# Patient Record
Sex: Male | Born: 1935 | Race: White | Hispanic: No | Marital: Single | Smoking: Former smoker
Health system: Southern US, Community
[De-identification: ages and names within clinical notes are randomized; demographics above are authoritative.]

## PROBLEM LIST (undated history)

## (undated) DIAGNOSIS — N289 Disorder of kidney and ureter, unspecified: Secondary | ICD-10-CM

## (undated) DIAGNOSIS — I2510 Atherosclerotic heart disease of native coronary artery without angina pectoris: Secondary | ICD-10-CM

## (undated) DIAGNOSIS — I4891 Unspecified atrial fibrillation: Secondary | ICD-10-CM

## (undated) DIAGNOSIS — I509 Heart failure, unspecified: Secondary | ICD-10-CM

## (undated) DIAGNOSIS — I10 Essential (primary) hypertension: Secondary | ICD-10-CM

## (undated) DIAGNOSIS — E785 Hyperlipidemia, unspecified: Secondary | ICD-10-CM

## (undated) HISTORY — PX: KNEE SURGERY: SHX244

## (undated) HISTORY — PX: SHOULDER SURGERY: SHX246

## (undated) HISTORY — PX: CARDIAC SURGERY: SHX584

## (undated) HISTORY — PX: INSERTION OF ICD: SHX6689

---

## 2011-06-09 ENCOUNTER — Emergency Department (INDEPENDENT_AMBULATORY_CARE_PROVIDER_SITE_OTHER): Payer: Medicare Other

## 2011-06-09 ENCOUNTER — Emergency Department (HOSPITAL_BASED_OUTPATIENT_CLINIC_OR_DEPARTMENT_OTHER)
Admission: EM | Admit: 2011-06-09 | Discharge: 2011-06-10 | Disposition: A | Discharge status: Home / Self Care | Payer: Medicare Other | Attending: Emergency Medicine | Admitting: Emergency Medicine

## 2011-06-09 ENCOUNTER — Encounter: Payer: Self-pay | Admitting: *Deleted

## 2011-06-09 DIAGNOSIS — S99919A Unspecified injury of unspecified ankle, initial encounter: Secondary | ICD-10-CM

## 2011-06-09 DIAGNOSIS — S8000XA Contusion of unspecified knee, initial encounter: Secondary | ICD-10-CM | POA: Insufficient documentation

## 2011-06-09 DIAGNOSIS — IMO0002 Reserved for concepts with insufficient information to code with codable children: Secondary | ICD-10-CM

## 2011-06-09 DIAGNOSIS — W208XXA Other cause of strike by thrown, projected or falling object, initial encounter: Secondary | ICD-10-CM | POA: Insufficient documentation

## 2011-06-09 DIAGNOSIS — I10 Essential (primary) hypertension: Secondary | ICD-10-CM | POA: Insufficient documentation

## 2011-06-09 DIAGNOSIS — S8990XA Unspecified injury of unspecified lower leg, initial encounter: Secondary | ICD-10-CM

## 2011-06-09 DIAGNOSIS — S93409A Sprain of unspecified ligament of unspecified ankle, initial encounter: Secondary | ICD-10-CM | POA: Insufficient documentation

## 2011-06-09 DIAGNOSIS — Z9181 History of falling: Secondary | ICD-10-CM

## 2011-06-09 DIAGNOSIS — I77819 Aortic ectasia, unspecified site: Secondary | ICD-10-CM

## 2011-06-09 DIAGNOSIS — I517 Cardiomegaly: Secondary | ICD-10-CM

## 2011-06-09 DIAGNOSIS — I2510 Atherosclerotic heart disease of native coronary artery without angina pectoris: Secondary | ICD-10-CM | POA: Insufficient documentation

## 2011-06-09 DIAGNOSIS — M949 Disorder of cartilage, unspecified: Secondary | ICD-10-CM

## 2011-06-09 DIAGNOSIS — S40019A Contusion of unspecified shoulder, initial encounter: Secondary | ICD-10-CM | POA: Insufficient documentation

## 2011-06-09 DIAGNOSIS — E119 Type 2 diabetes mellitus without complications: Secondary | ICD-10-CM | POA: Insufficient documentation

## 2011-06-09 DIAGNOSIS — M899 Disorder of bone, unspecified: Secondary | ICD-10-CM

## 2011-06-09 HISTORY — DX: Essential (primary) hypertension: I10

## 2011-06-09 HISTORY — DX: Atherosclerotic heart disease of native coronary artery without angina pectoris: I25.10

## 2011-06-09 NOTE — ED Notes (Signed)
Pt states he was loading a garden tractor and it fell on him. C/O bruising and pain mostly on the left side from his ankle up. Left ankle is swollen and pt has bruising there as well as the leg and groin area. Ambulatory to ED

## 2011-06-09 NOTE — ED Provider Notes (Signed)
Scribed for Dr. Judd Lien, the patient was seen in room 01. This chart was scribed by Hillery Hunter. This patient's care was started at 22:20.   History   CSN: [id number] Arrival date & time: 06/09/2011  9:58 PM  Chief Complaint  Patient presents with  . Trauma   Patient is a 77 y.o. male presenting with trauma. The history is provided by the patient and a relative.  Trauma Pertinent negatives include no chest pain, no abdominal pain, no headaches and no shortness of breath.    Lenis Nettleton is a 77 y.o. male who presents to the Emergency Department complaining of an accidental trauma. He reports that he was at home loading an 800lb garden tractor onto a trailer and because the ramp was too steep, the wheels turned off and the tractor fell backwards onto him. He complains now primarily of left ankle pain but has some abrasions on his left knee and shoulder as well. He was walking around after the incident and has increased pain of the ankle with weight bearing. He has a history of bilateral knee replacements (5 months ago for right side and 5 years ago on the left) and is taking coumadin. He denies dyspnea, head pain, chest pain, or other complaints.  Past Medical History  Diagnosis Date  . Diabetes mellitus   . Hypertension   . Coronary artery disease     Past Surgical History  Procedure Date  . Cardiac surgery   . Shoulder surgery   . Knee surgery     History reviewed. No pertinent family history.  History  Substance Use Topics  . Smoking status: Never Smoker   . Smokeless tobacco: Not on file  . Alcohol Use: No      Review of Systems  HENT: Negative for neck pain.   Respiratory: Negative for shortness of breath.   Cardiovascular: Negative for chest pain.  Gastrointestinal: Negative for abdominal pain.  Musculoskeletal: Negative for back pain.  Neurological: Negative for headaches.  Psychiatric/Behavioral: Negative for confusion.  All other systems reviewed and  are negative.    Physical Exam  BP 133/72  Pulse 68  Temp(Src) 98.6 F (37 C) (Oral)  Resp 20  Ht 5\' 11"  (1.803 m)  Wt 208 lb (94.348 kg)  BMI 29.01 kg/m2  SpO2 97%  Physical Exam  Nursing note and vitals reviewed. Constitutional:       Awake, alert, nontoxic appearance with baseline speech for patient.  HENT:  Head: Atraumatic.  Eyes: EOM are normal. Pupils are equal, round, and reactive to light. Right eye exhibits no discharge. Left eye exhibits no discharge.  Neck: Neck supple.  Cardiovascular: Normal rate and regular rhythm.   No murmur heard. Pulmonary/Chest: Effort normal and breath sounds normal. No stridor. No respiratory distress. He exhibits no tenderness.       Breath sounds equal  Abdominal: Soft. Bowel sounds are normal. He exhibits no mass. There is no tenderness. There is no rebound.  Musculoskeletal: He exhibits no tenderness.       Baseline ROM, moves extremities with no obvious new focal weakness.  Lymphadenopathy:    He has no cervical adenopathy.  Neurological:       Awake, alert, cooperative and aware of situation; motor strength bilaterally; sensation normal to light touch bilaterally  Skin: No rash noted.  Psychiatric: He has a normal mood and affect.    ED Course  Procedures  OTHER DATA REVIEWED: Nursing notes, vital signs, and past medical records reviewed.  DIAGNOSTIC STUDIES: Oxygen Saturation is 97% on room air, normal by my interpretation.    RADIOLOGY:   LEFT SHOULDER - 2+ VIEW  IMPRESSION:  1. No evidence of fracture or dislocation. 2. Lucencies within the humeral head may relate to osteoporosis or degenerative change. Cannot exclude myeloma. Recommend clinical correlation.  Original Report Authenticated By: Genevive Bi, M.D.  ------------------  CHEST - 1 VIEW  IMPRESSION: No evidence of thoracic trauma.  Original Report Authenticated By: Genevive Bi, M.D.  --------------------  LEFT KNEE - COMPLETE 4+  VIEW  IMPRESSION: No evidence of left knee fracture.  Original Report Authenticated By: Genevive Bi, M.D.  --------------------  LEFT ANKLE COMPLETE - 3+ VIEW  IMPRESSION: No evidence of left ankle fracture.  Original Report Authenticated By: Genevive Bi, M.D.  -------------------  ED COURSE / COORDINATION OF CARE: 22:31. Ordered XR left shoulder, left ankle, left knee and chest.   MDM: Xrays all normal.  Will discharge with rest, ice, time.  Declines pain medications.   IMPRESSION: Diagnoses that have been ruled out:  Diagnoses that are still under consideration:  Final diagnoses:     SCRIBE ATTESTATION: I personally performed the services described in this documentation, which was scribed in my presence. The recorded information has been reviewed and considered. Geoffery Lyons, MD      Geoffery Lyons, MD 06/09/11 [phone]

## 2012-09-01 IMAGING — CR DG SHOULDER 2+V*L*
3 series · 3 of 3 positions shown · non-contrast
Comparison: None.

CLINICAL DATA: Trauma the

LEFT SHOULDER - 2+ VIEW

[t shoulder ap internal left]
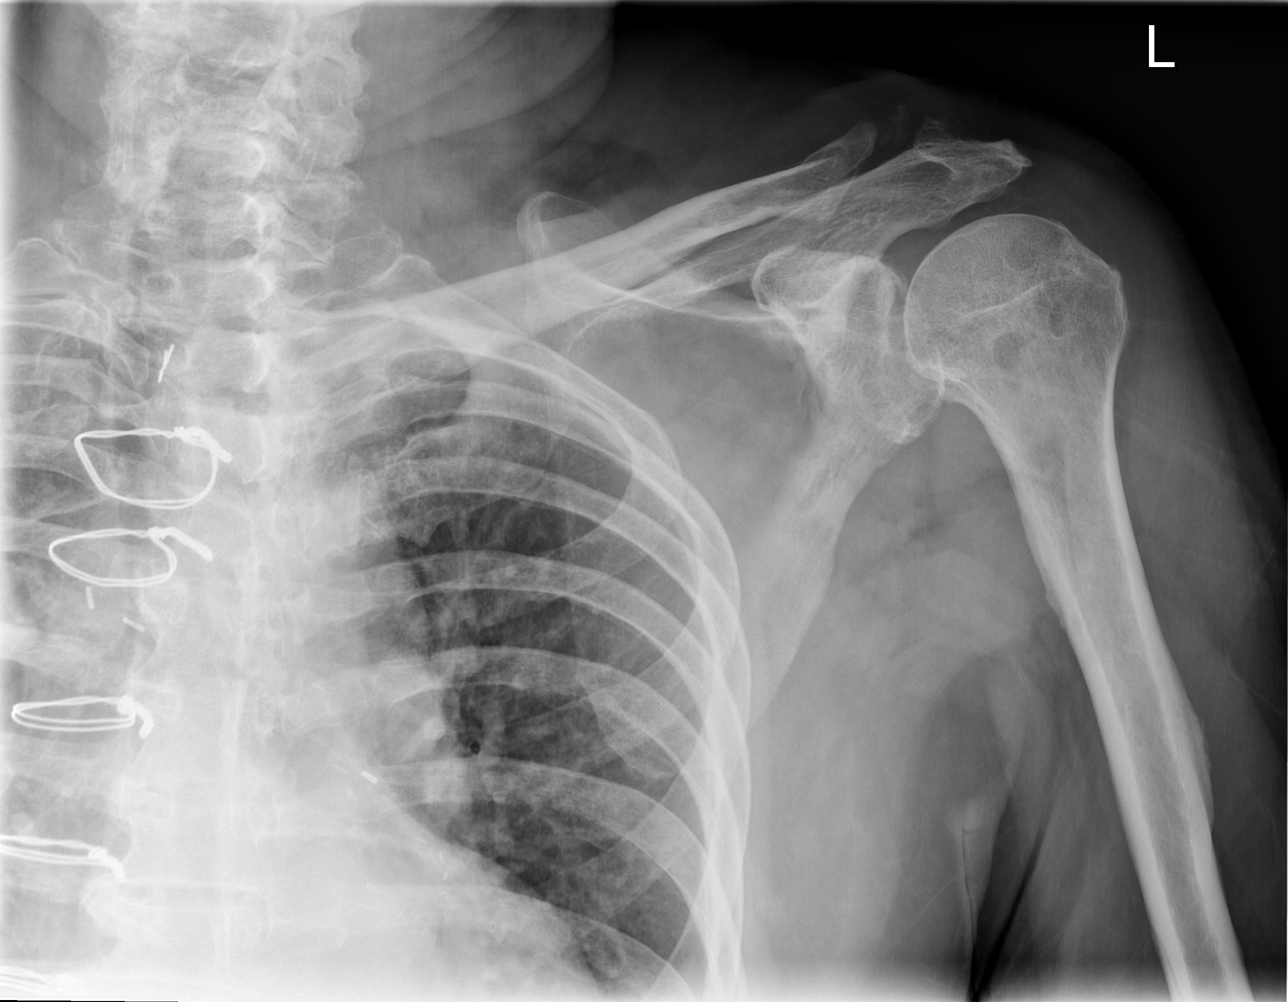

[t shoulder ap external left]
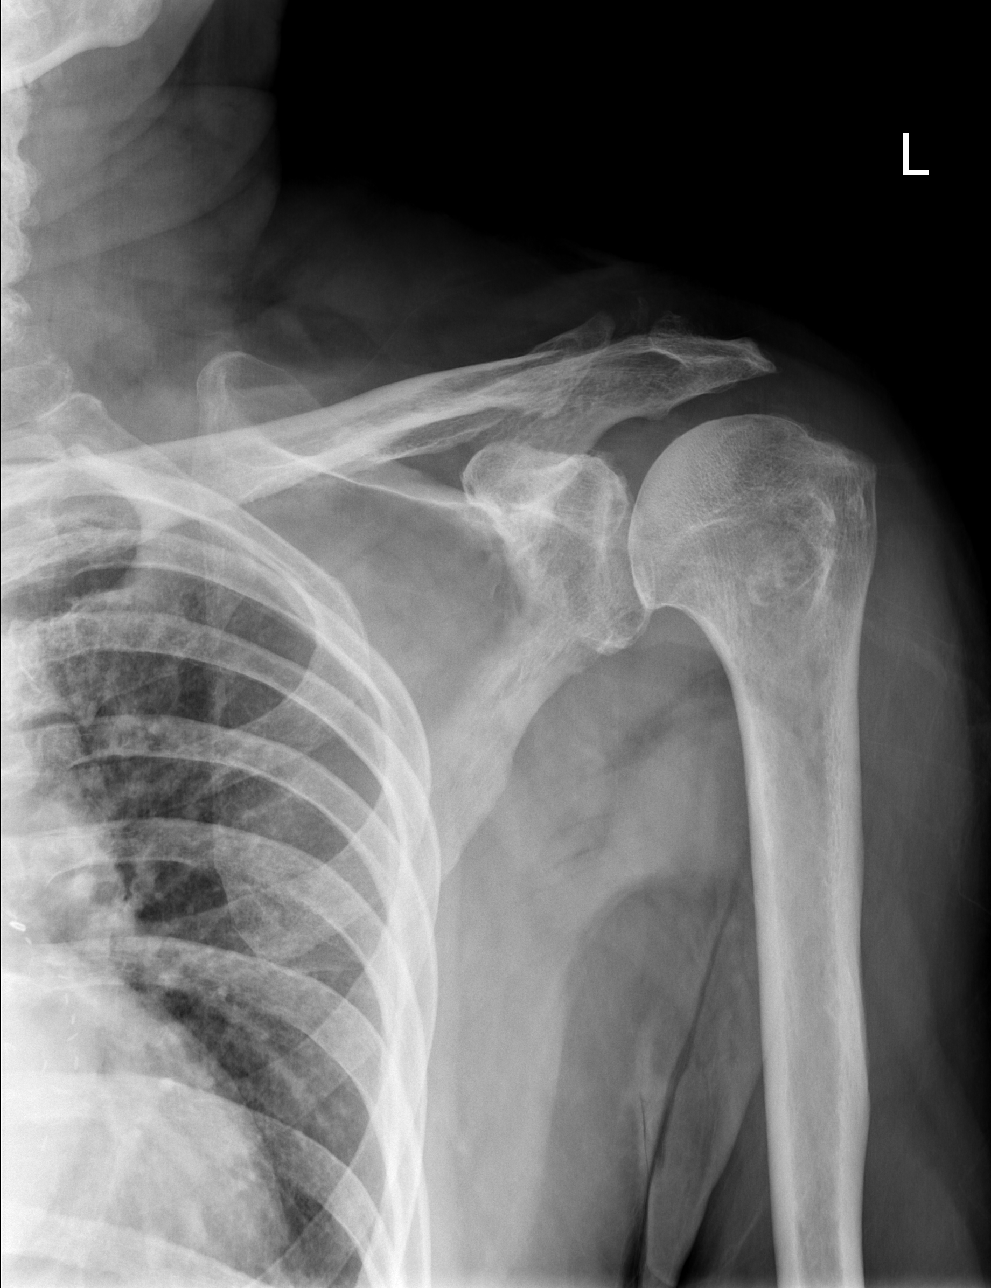

[t shoulder y view left]
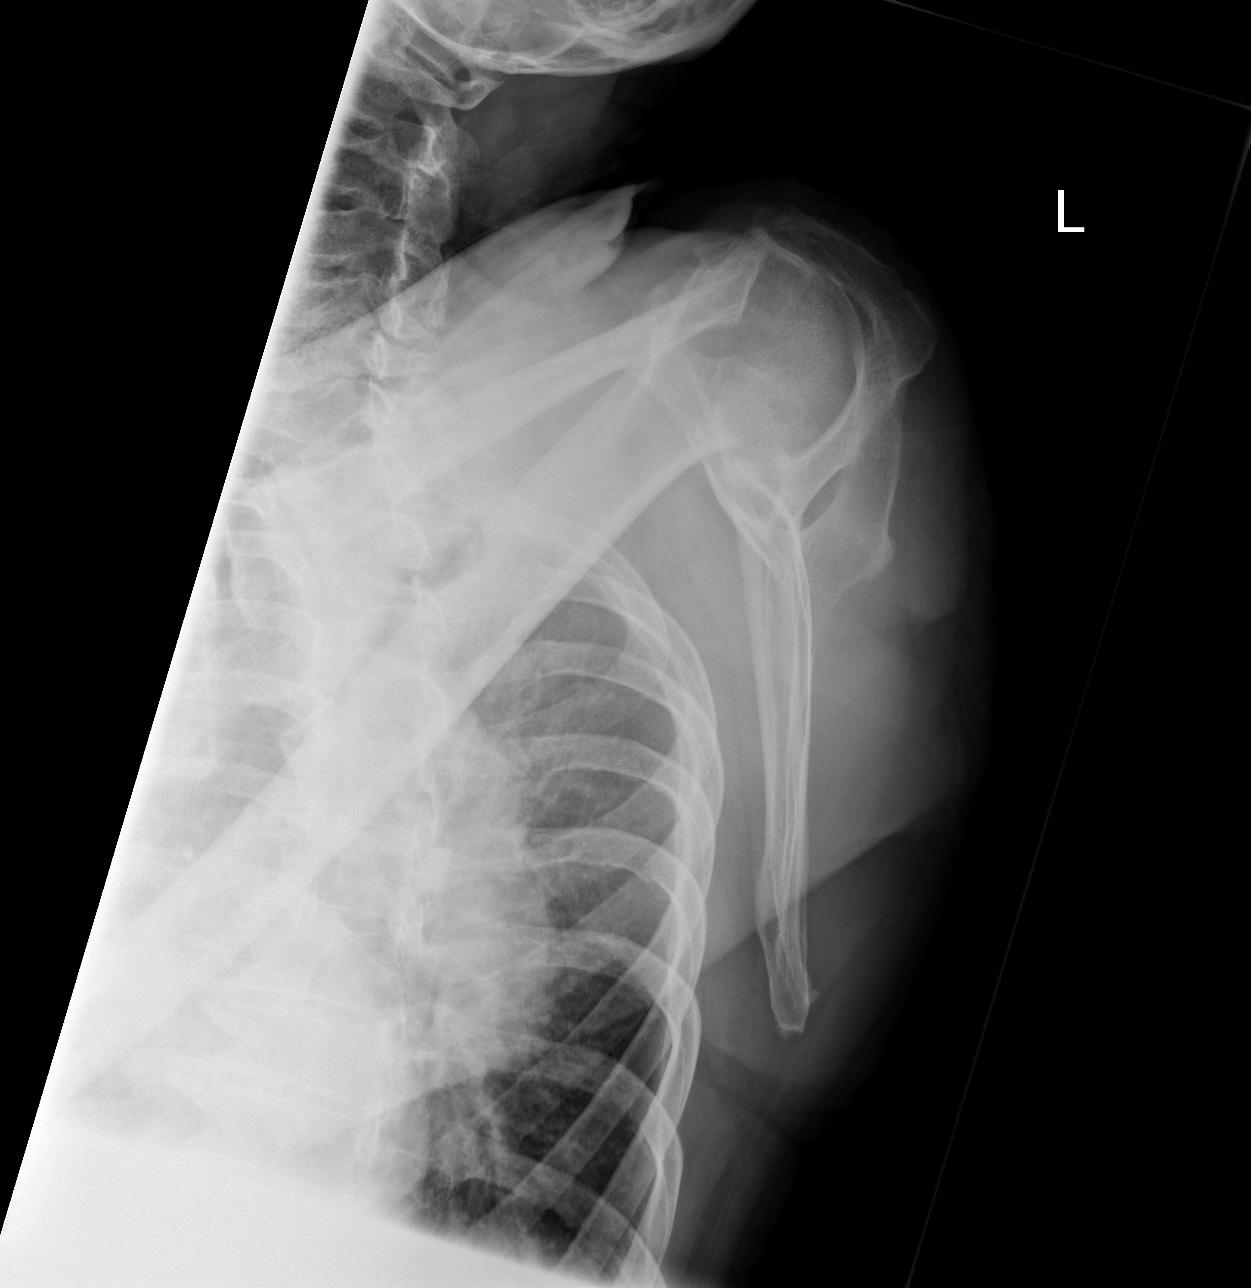

[3 of 3 positions shown; findings below may reference images not displayed]

FINDINGS: No evidence of fracture or dislocation of the left
shoulder.  There are lucencies within the left humeral head and
glenoid fossa.
IMPRESSION: 1.  No evidence of fracture or dislocation.
2.  Lucencies within the humeral head may relate to osteoporosis or
degenerative change.  Cannot exclude myeloma.  Recommend clinical
correlation.

## 2012-09-01 IMAGING — CR DG ANKLE COMPLETE 3+V*L*
3 series · 3 of 3 positions shown · non-contrast
Comparison: None.

CLINICAL DATA: Blunt trauma to the ankle from a very heavy object

LEFT ANKLE COMPLETE - 3+ VIEW

[t ankle joint ap left]
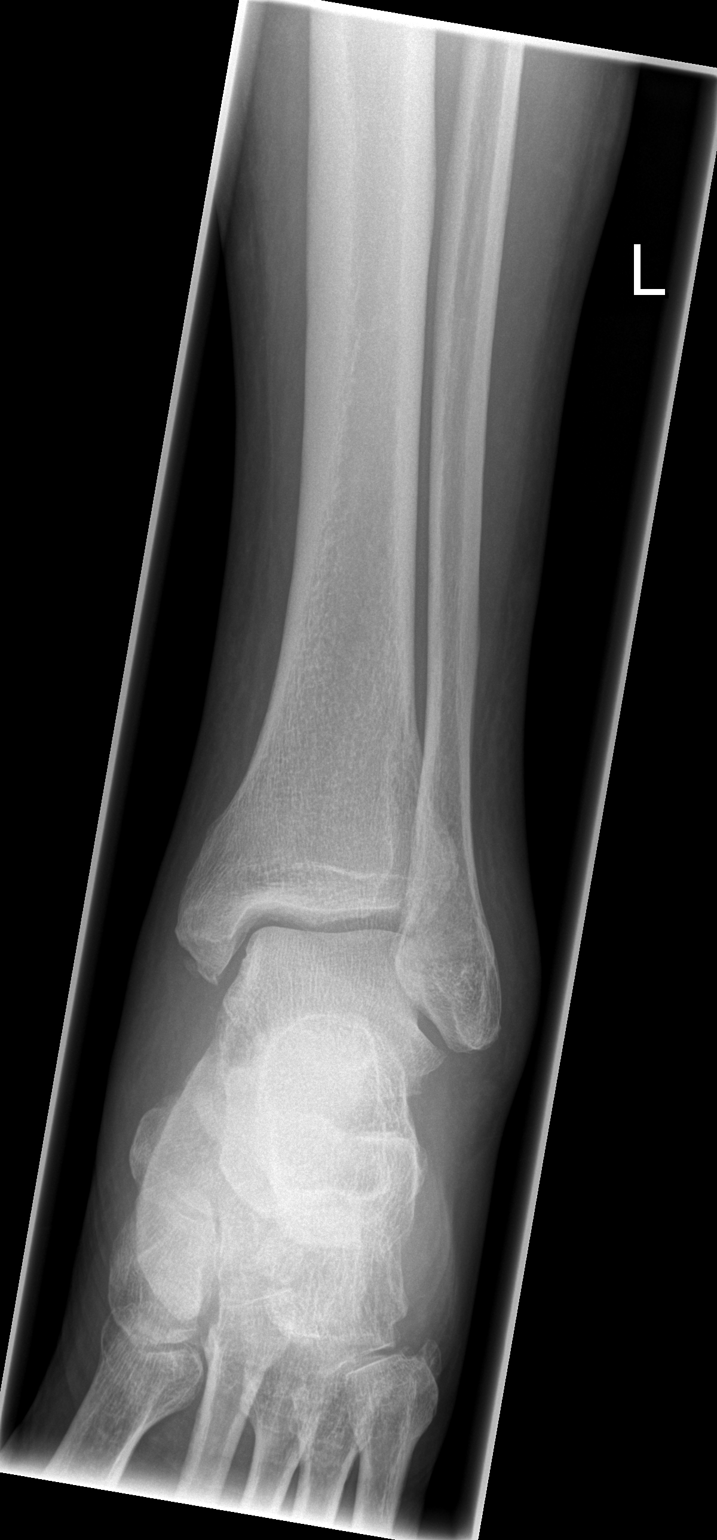

[t ankle joint oblique left]
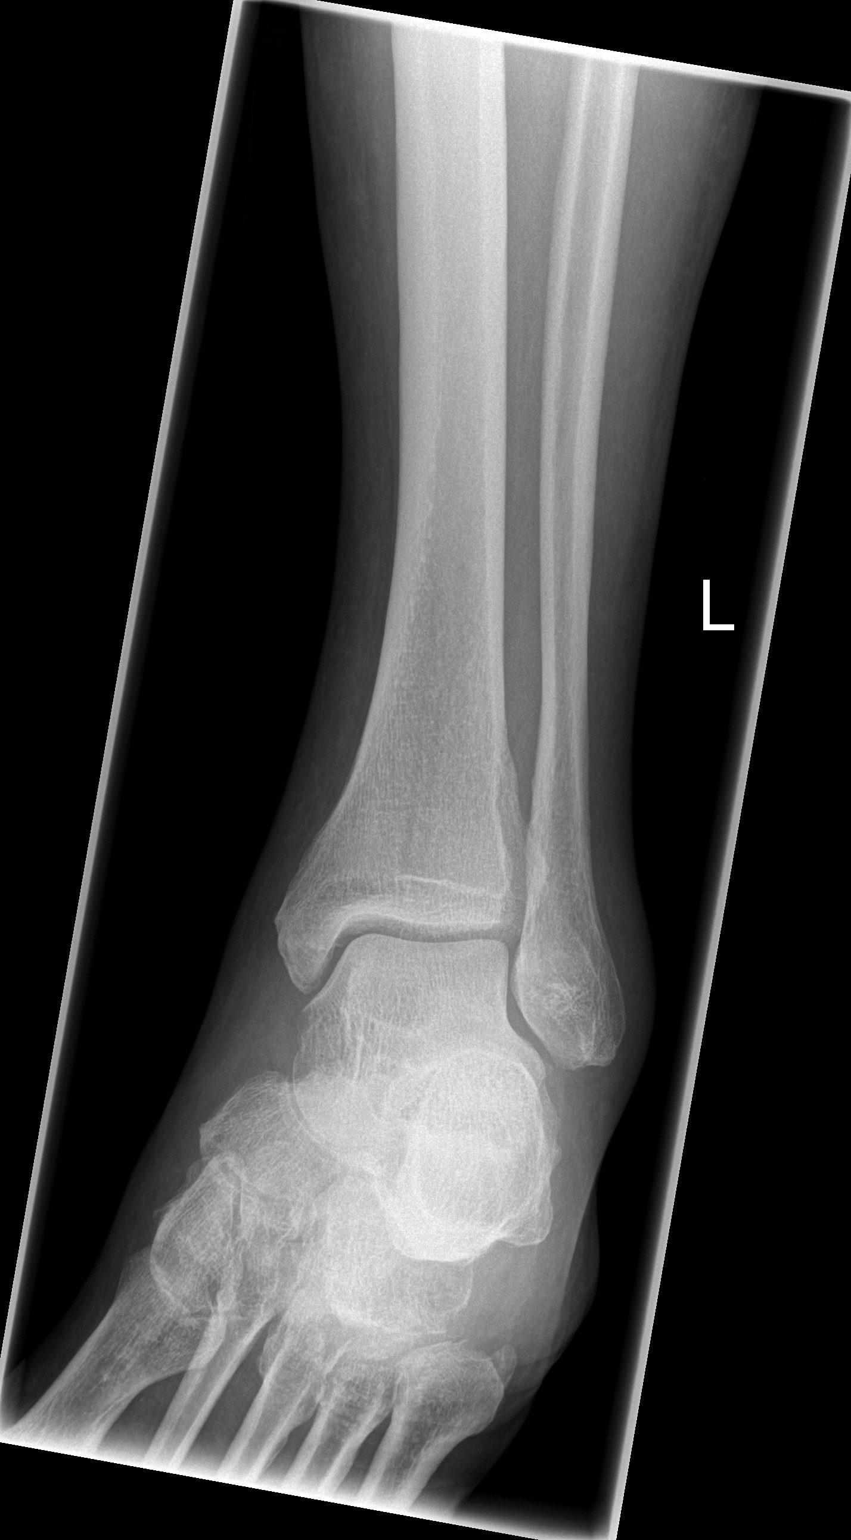

[t ankle joint lat left]
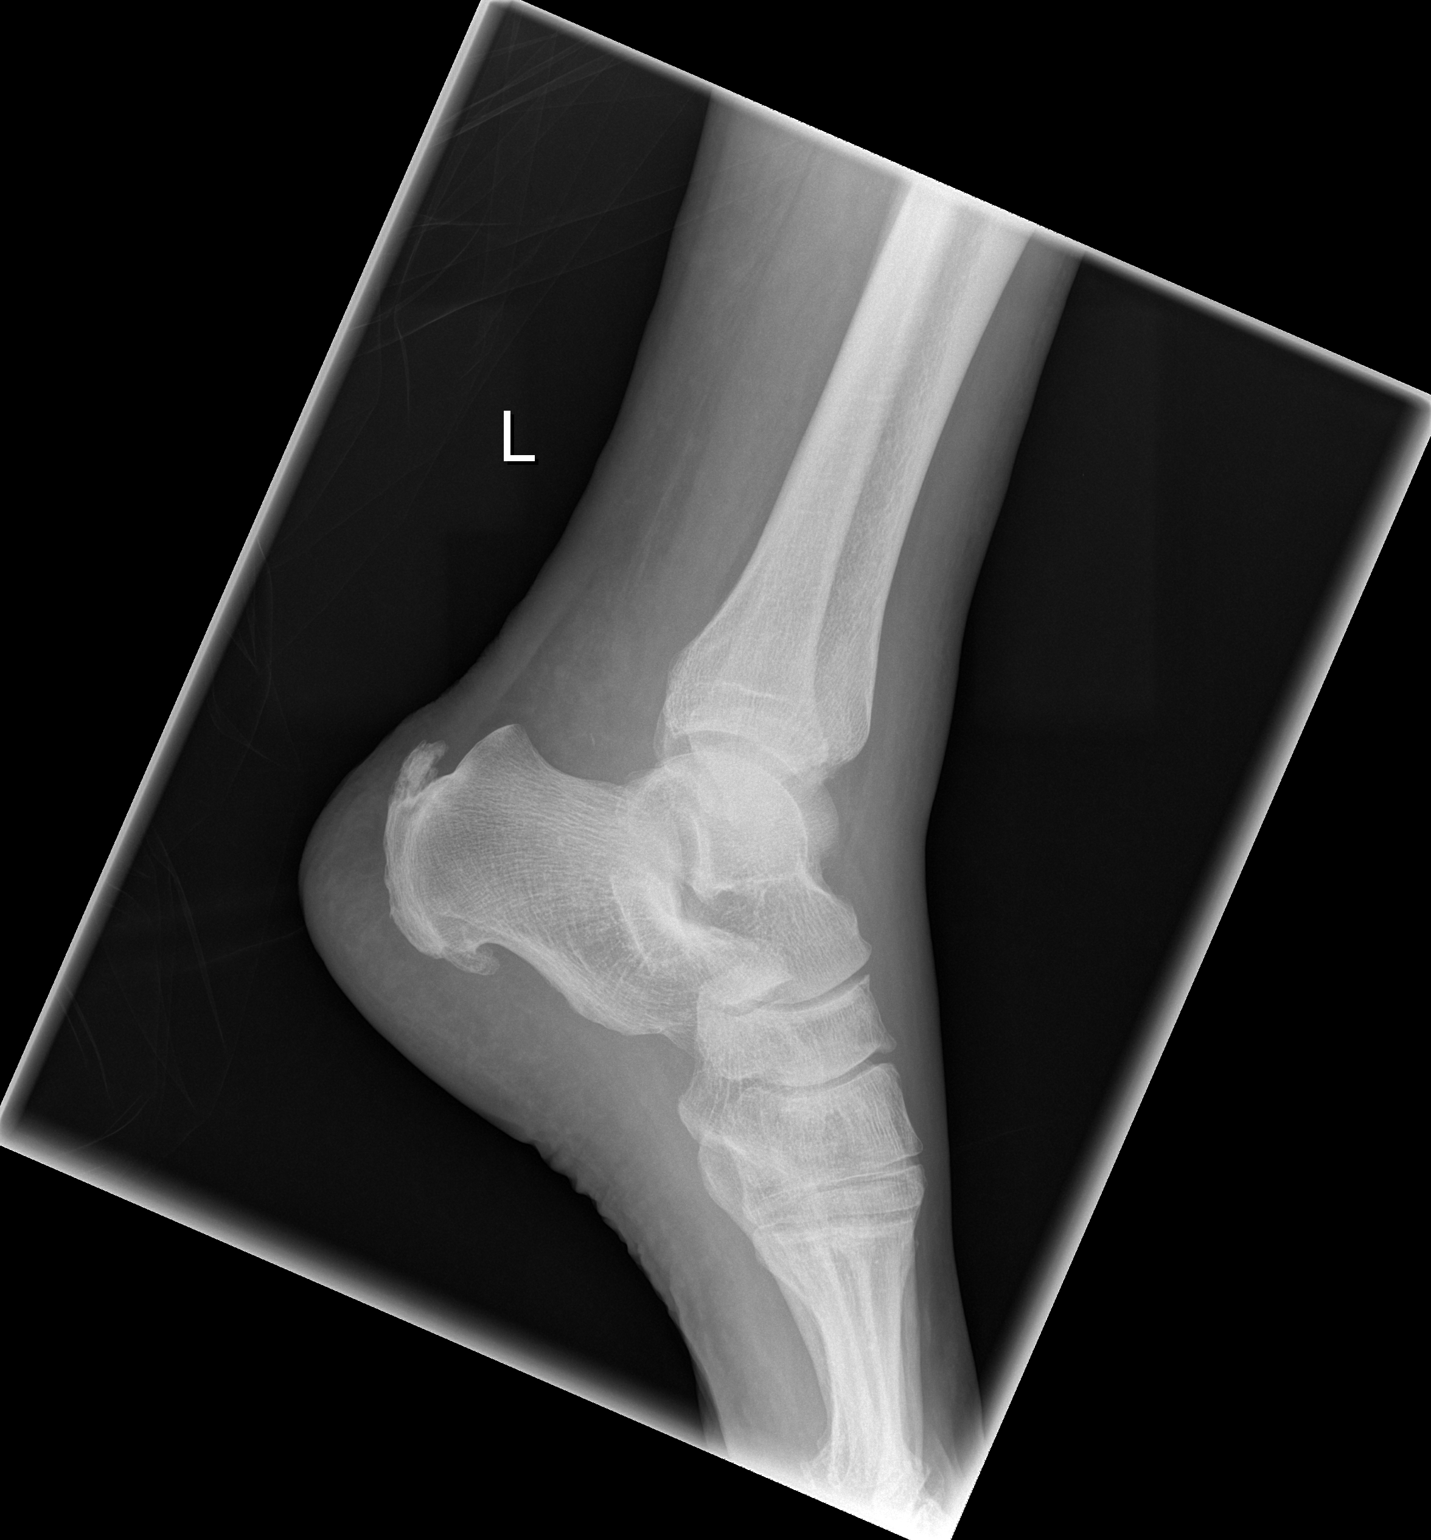

[3 of 3 positions shown; findings below may reference images not displayed]

FINDINGS: The ankle mortise intact.  The talar dome is normal.  No
evidence of malleolar fracture.  The calcaneus appears normal.
There is spurring of the Achilles and plantar aspects of the
calcaneus.
IMPRESSION: No evidence of left ankle fracture.

## 2012-09-01 IMAGING — CR DG KNEE COMPLETE 4+V*L*
4 series · 4 of 4 positions shown · non-contrast
Comparison: None.

CLINICAL DATA: Fall, heavy object injury

LEFT KNEE - COMPLETE 4+ VIEW

[t knee ap left]
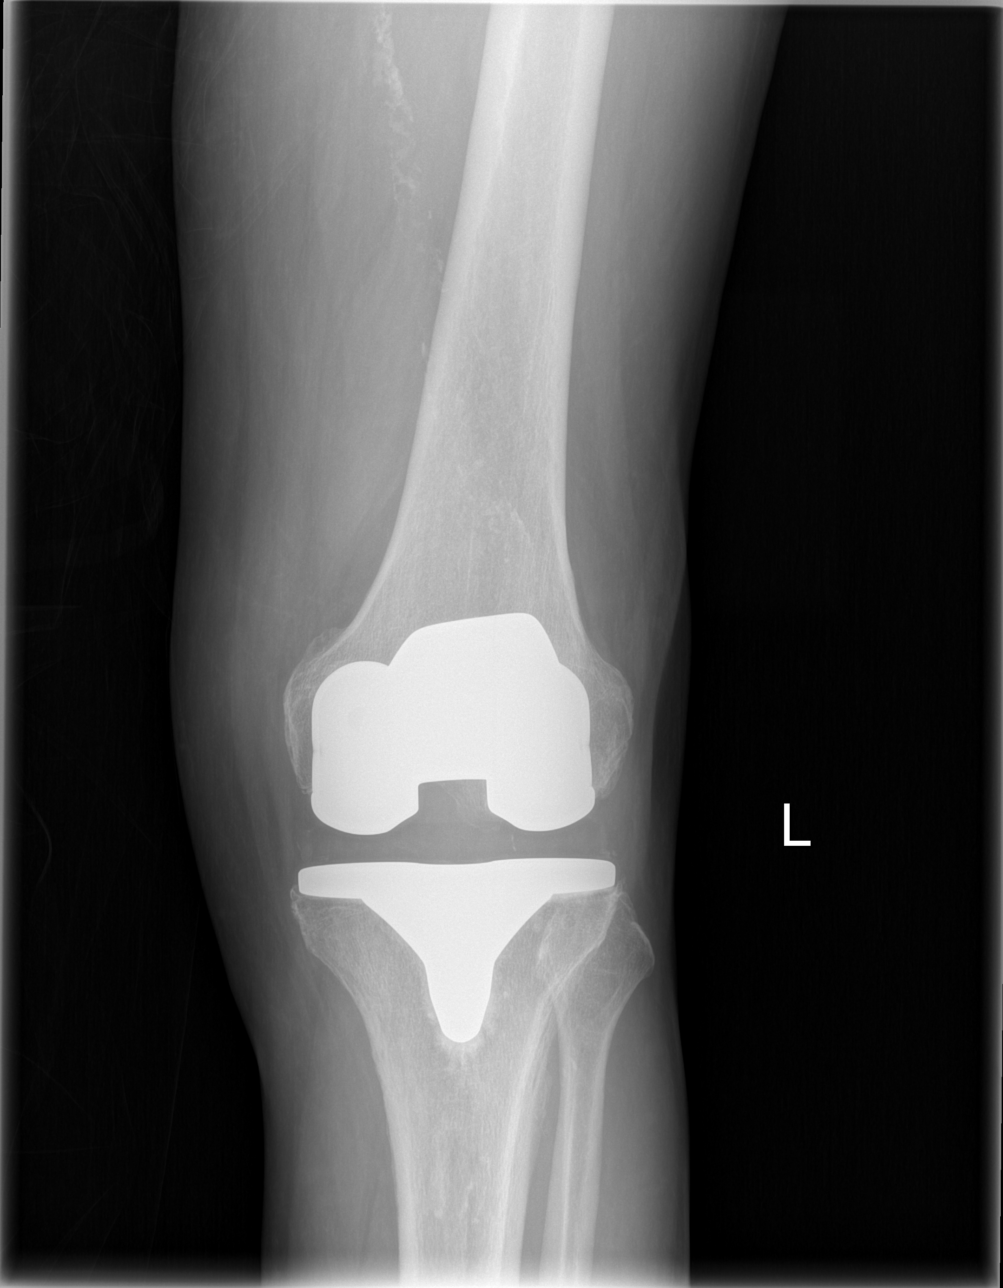

[t knee oblique left (1 of 2)]
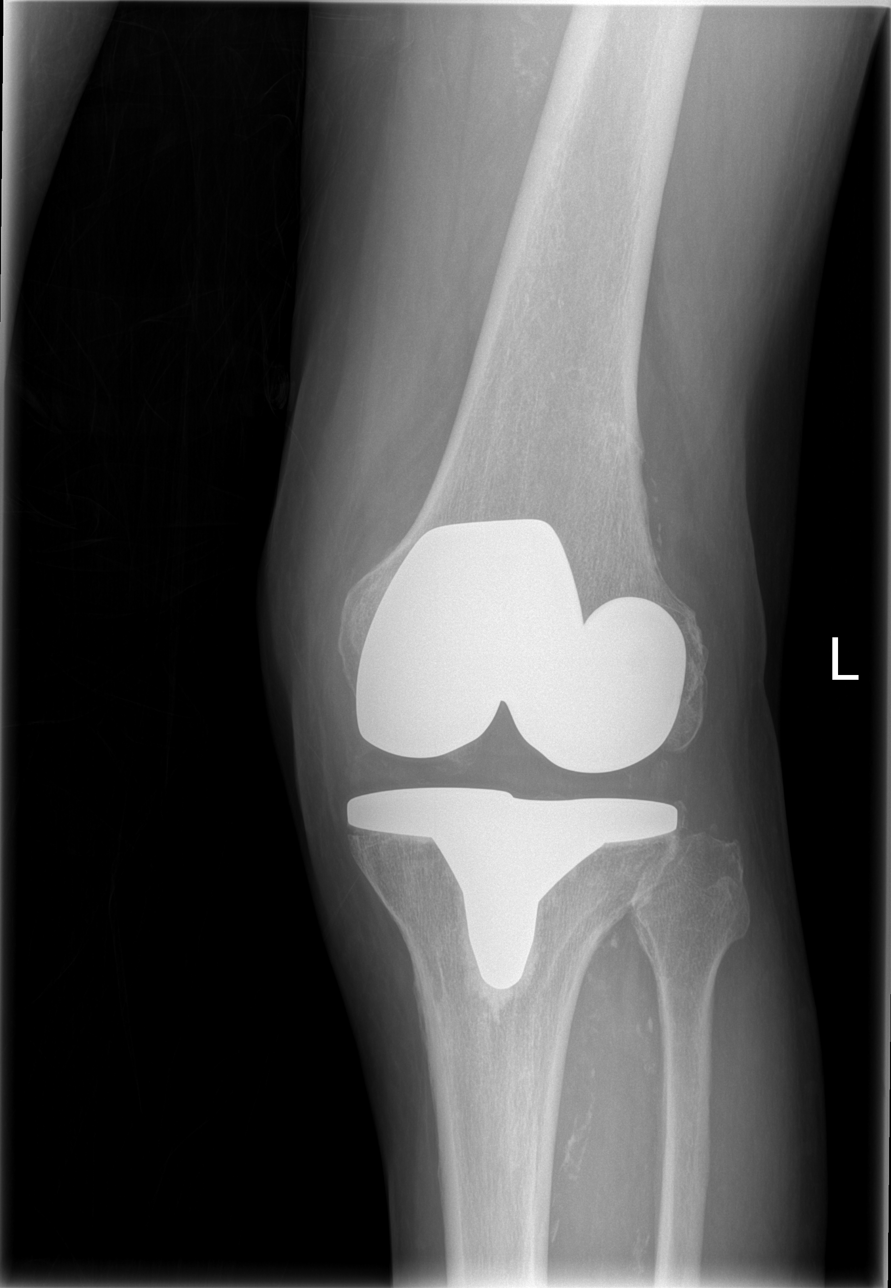

[t knee oblique left (2 of 2)]
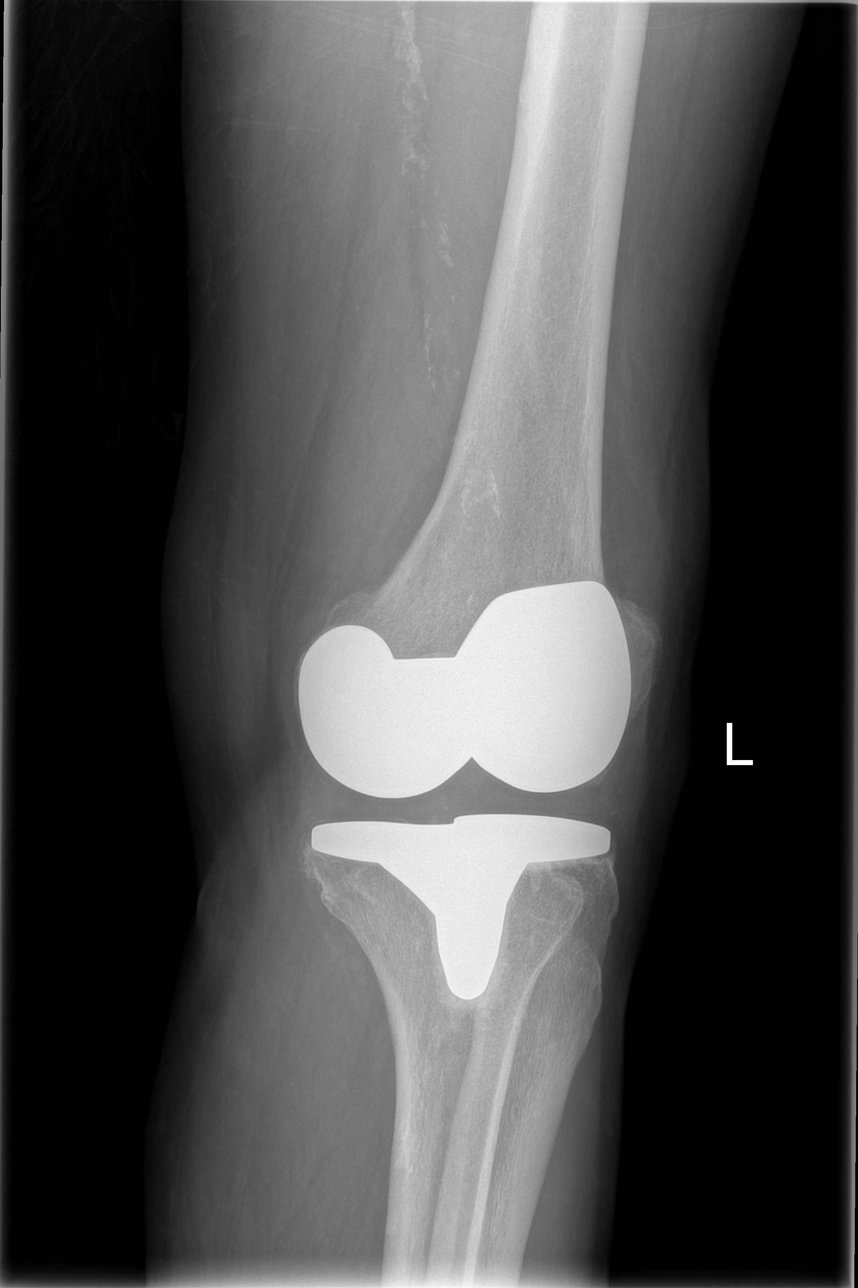

[t knee lat left]
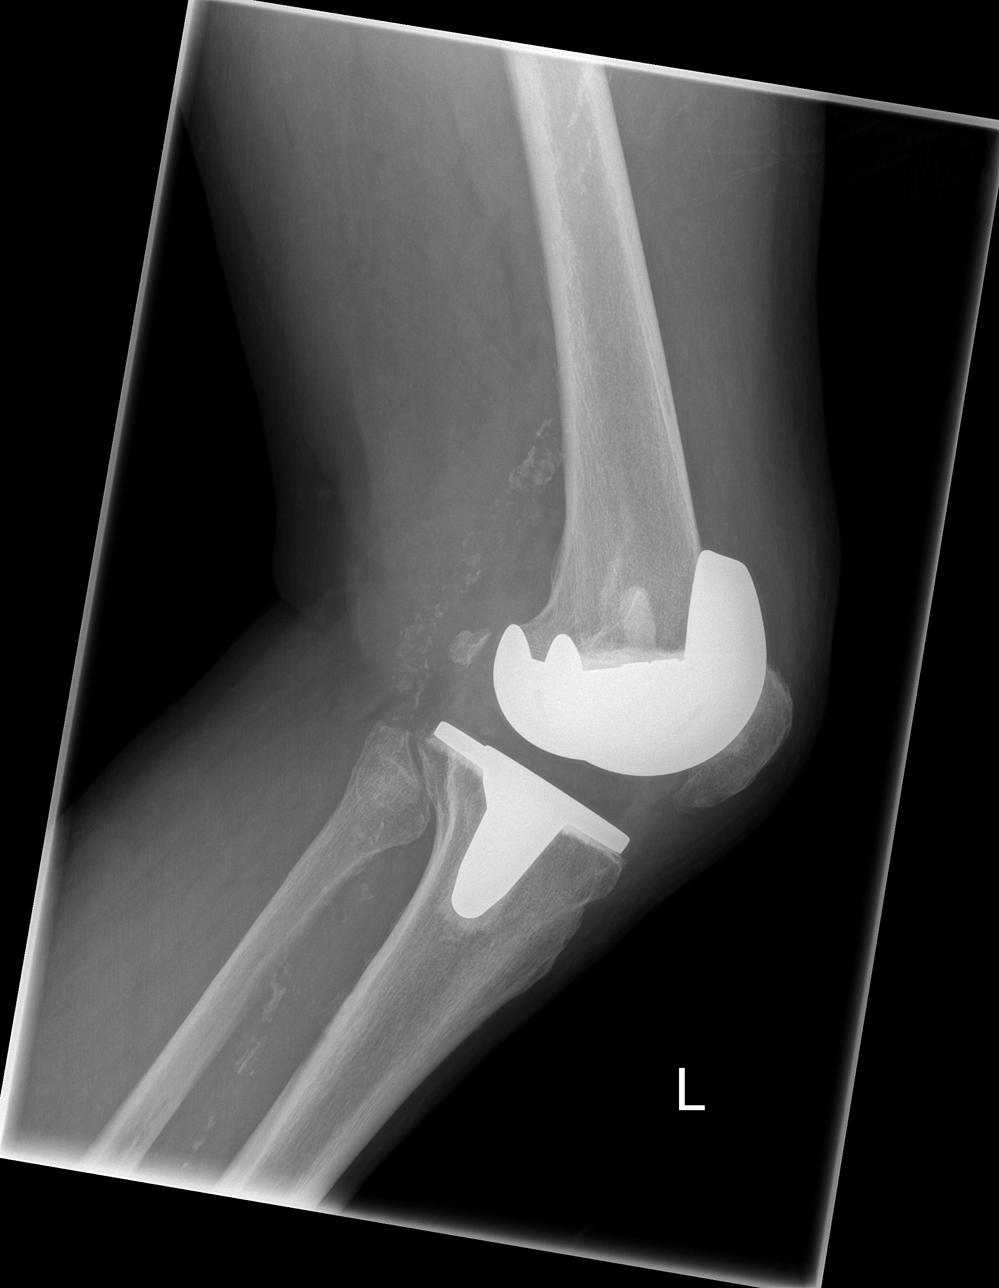

[4 of 4 positions shown; findings below may reference images not displayed]

FINDINGS: Left total knee arthroplasty noted.  No evidence of
fracture of the tibia or femur.  No joint effusion.  Vascular
calcification noted.
IMPRESSION: No evidence of left knee fracture.

## 2021-09-20 ENCOUNTER — Emergency Department (HOSPITAL_BASED_OUTPATIENT_CLINIC_OR_DEPARTMENT_OTHER)
Admission: EM | Admit: 2021-09-20 | Discharge: 2021-09-20 | Disposition: A | Discharge status: Home / Self Care | Payer: Medicare Other | Attending: Emergency Medicine | Admitting: Emergency Medicine

## 2021-09-20 ENCOUNTER — Emergency Department (HOSPITAL_BASED_OUTPATIENT_CLINIC_OR_DEPARTMENT_OTHER): Payer: Medicare Other

## 2021-09-20 ENCOUNTER — Encounter (HOSPITAL_BASED_OUTPATIENT_CLINIC_OR_DEPARTMENT_OTHER): Payer: Self-pay

## 2021-09-20 ENCOUNTER — Other Ambulatory Visit: Payer: Self-pay

## 2021-09-20 DIAGNOSIS — I110 Hypertensive heart disease with heart failure: Secondary | ICD-10-CM | POA: Diagnosis not present

## 2021-09-20 DIAGNOSIS — Z87891 Personal history of nicotine dependence: Secondary | ICD-10-CM | POA: Insufficient documentation

## 2021-09-20 DIAGNOSIS — Z20822 Contact with and (suspected) exposure to covid-19: Secondary | ICD-10-CM | POA: Insufficient documentation

## 2021-09-20 DIAGNOSIS — J101 Influenza due to other identified influenza virus with other respiratory manifestations: Secondary | ICD-10-CM | POA: Insufficient documentation

## 2021-09-20 DIAGNOSIS — E119 Type 2 diabetes mellitus without complications: Secondary | ICD-10-CM | POA: Diagnosis not present

## 2021-09-20 DIAGNOSIS — R059 Cough, unspecified: Secondary | ICD-10-CM | POA: Diagnosis present

## 2021-09-20 DIAGNOSIS — I2510 Atherosclerotic heart disease of native coronary artery without angina pectoris: Secondary | ICD-10-CM | POA: Insufficient documentation

## 2021-09-20 DIAGNOSIS — I509 Heart failure, unspecified: Secondary | ICD-10-CM | POA: Insufficient documentation

## 2021-09-20 HISTORY — DX: Heart failure, unspecified: I50.9

## 2021-09-20 LAB — RESP PANEL BY RT-PCR (FLU A&B, COVID) ARPGX2
Influenza A by PCR: POSITIVE — AB
Influenza B by PCR: NEGATIVE
SARS Coronavirus 2 by RT PCR: NEGATIVE

## 2021-09-20 MED ORDER — BENZONATATE 100 MG PO CAPS: 100.0000 mg | ORAL_CAPSULE | Freq: Three times a day (TID) | ORAL | 0 refills | Status: AC

## 2021-09-20 NOTE — ED Triage Notes (Signed)
Per pt and daughter pt with flu like sx x 4 days-NAD-steady gait

## 2021-09-20 NOTE — Discharge Instructions (Signed)
Please refer to the attached instructions 

## 2021-09-20 NOTE — ED Provider Notes (Signed)
[facility] EMERGENCY DEPARTMENT Provider Note   CSN: [id number] Arrival date & time: 09/20/21  1310     History Chief Complaint  Patient presents with   Cough    Nicolas Adams is a 87 y.o. male.  Patient presents for evaluation of URI symptoms and cough, onset 4 days ago. No documented fever or chills. Endorses runny nose. Denies sore throat, chest pain, abdominal pain, nausea or vomiting. Patient reports that he is unable to maintain supine position in bed due to persistent cough. He has been sleeping in a recliner. No pedal edema  The history is provided by the patient and a relative. No language interpreter was used.  Cough Cough characteristics:  Dry and non-productive Severity:  Moderate Onset quality:  Gradual Duration:  4 days Timing:  Intermittent Progression:  Waxing and waning Chronicity:  New Worsened by:  Lying down Associated symptoms: rhinorrhea, shortness of breath and sinus congestion   Associated symptoms: no chest pain, no chills, no fever, no myalgias, no rash and no sore throat       Past Medical History:  Diagnosis Date   CHF (congestive heart failure) (HCC)    Coronary artery disease    Diabetes mellitus    Hypertension     There are no problems to display for this patient.   Past Surgical History:  Procedure Laterality Date   CARDIAC SURGERY     HIP SURGERY     KNEE SURGERY     SHOULDER SURGERY         No family history on file.  Social History   Tobacco Use   Smoking status: Former    Types: Cigarettes   Smokeless tobacco: Never  Vaping Use   Vaping Use: Never used  Substance Use Topics   Alcohol use: No   Drug use: No    Home Medications Prior to Admission medications   Medication Sig Start Date End Date Taking? Authorizing Provider  acetaminophen (TYLENOL) 500 MG tablet Take 1,500 mg by mouth every 6 (six) hours as needed. pain     [provider]  OVER THE COUNTER MEDICATION Take 1 tablet by mouth  daily. Sande Brothers     [provider]  PRESCRIPTION MEDICATION Take by mouth daily. Patient takes unknown blood pressure tab, diabetis medication     [provider]    Allergies    Aspirin and Corticosteroids  Review of Systems   Review of Systems  Constitutional:  Negative for chills and fever.  HENT:  Positive for rhinorrhea. Negative for sore throat.   Respiratory:  Positive for cough and shortness of breath.   Cardiovascular:  Negative for chest pain and leg swelling.  Musculoskeletal:  Negative for myalgias.  Skin:  Negative for rash.  All other systems reviewed and are negative.  Physical Exam Updated Vital Signs BP (!) 149/86 (BP Location: Left Arm)    Pulse 73    Temp 99.5 F (37.5 C) (Oral)    Resp 20    Ht 5\' 11"  (1.803 m)    Wt 86.6 kg    SpO2 97%    BMI 26.64 kg/m   Physical Exam Vitals and nursing note reviewed.  Constitutional:      Appearance: He is not ill-appearing.  HENT:     Head: Normocephalic.     Nose: Congestion present.  Eyes:     Conjunctiva/sclera: Conjunctivae normal.  Cardiovascular:     Rate and Rhythm: Normal rate and regular rhythm.  Pulmonary:  Effort: Pulmonary effort is normal.     Breath sounds: Rales present.  Abdominal:     Palpations: Abdomen is soft.  Musculoskeletal:        General: Normal range of motion.     Cervical back: Neck supple.     Right lower leg: No edema.     Left lower leg: No edema.  Lymphadenopathy:     Cervical: No cervical adenopathy.  Skin:    Findings: No rash.  Neurological:     Mental Status: He is alert and oriented to person, place, and time.  Psychiatric:        Mood and Affect: Mood normal.        Behavior: Behavior normal.    ED Results / Procedures / Treatments   Labs (all labs ordered are listed, but only abnormal results are displayed) Labs Reviewed  RESP PANEL BY RT-PCR (FLU A&B, COVID) ARPGX2 - Abnormal; Notable for the following components:      Result Value    Influenza A by PCR POSITIVE (*)    All other components within normal limits    EKG None  Radiology DG Chest Portable 1 View  Result Date: 09/20/2021 CLINICAL DATA:  Cough and flu-like symptoms. EXAM: PORTABLE CHEST 1 VIEW COMPARISON:  Chest x-ray dated August 07, 2021. FINDINGS: Unchanged left chest wall pacemaker. Stable cardiomegaly. Normal pulmonary vascularity. No focal consolidation, pleural effusion, or pneumothorax. No acute osseous abnormality. IMPRESSION: 1. No active disease. Electronically Signed   By: Obie Dredge M.D.   On: 09/20/2021 14:53    Procedures Procedures   Medications Ordered in ED Medications - No data to display  ED Course  I have reviewed the triage vital signs and the nursing notes.  Pertinent labs & imaging results that were available during my care of the patient were reviewed by me and considered in my medical decision making (see chart for details).    MDM Rules/Calculators/A&P                           Pt symptoms consistent with URI. Positive for influenza A. CXR negative for acute infiltrate. Pt will be discharged with symptomatic treatment.  Discussed return precautions.  Pt is hemodynamically stable & in NAD prior to discharge. Patient will be discharged with instructions to orally hydrate, rest, and use over-the-counter medications such as anti-inflammatories ibuprofen and Aleve for muscle aches and Tylenol for fever.  Patient will also be given a cough suppressant.   Final Clinical Impression(s) / ED Diagnoses Final diagnoses:  Influenza A    Rx / DC Orders ED Discharge Orders          Ordered    benzonatate (TESSALON) 100 MG capsule  Every 8 hours        09/20/21 1533             Felicie Morn, NP 09/20/21 1540    Milagros Loll, MD 09/23/21 [phone]

## 2024-08-22 ENCOUNTER — Other Ambulatory Visit: Payer: Self-pay

## 2024-08-22 ENCOUNTER — Emergency Department (HOSPITAL_BASED_OUTPATIENT_CLINIC_OR_DEPARTMENT_OTHER)

## 2024-08-22 ENCOUNTER — Encounter (HOSPITAL_BASED_OUTPATIENT_CLINIC_OR_DEPARTMENT_OTHER): Payer: Self-pay

## 2024-08-22 ENCOUNTER — Emergency Department (HOSPITAL_BASED_OUTPATIENT_CLINIC_OR_DEPARTMENT_OTHER)
Admission: EM | Admit: 2024-08-22 | Discharge: 2024-08-22 | Disposition: A | Discharge status: Home / Self Care | Attending: Emergency Medicine | Admitting: Emergency Medicine

## 2024-08-22 DIAGNOSIS — I4891 Unspecified atrial fibrillation: Secondary | ICD-10-CM | POA: Insufficient documentation

## 2024-08-22 DIAGNOSIS — I509 Heart failure, unspecified: Secondary | ICD-10-CM | POA: Insufficient documentation

## 2024-08-22 DIAGNOSIS — S01122A Laceration with foreign body of left eyelid and periocular area, initial encounter: Secondary | ICD-10-CM | POA: Diagnosis not present

## 2024-08-22 DIAGNOSIS — S65212A Laceration of superficial palmar arch of left hand, initial encounter: Secondary | ICD-10-CM

## 2024-08-22 DIAGNOSIS — S0181XA Laceration without foreign body of other part of head, initial encounter: Secondary | ICD-10-CM | POA: Insufficient documentation

## 2024-08-22 DIAGNOSIS — I110 Hypertensive heart disease with heart failure: Secondary | ICD-10-CM | POA: Insufficient documentation

## 2024-08-22 DIAGNOSIS — S61412A Laceration without foreign body of left hand, initial encounter: Secondary | ICD-10-CM | POA: Diagnosis not present

## 2024-08-22 DIAGNOSIS — E119 Type 2 diabetes mellitus without complications: Secondary | ICD-10-CM | POA: Diagnosis not present

## 2024-08-22 DIAGNOSIS — S0990XA Unspecified injury of head, initial encounter: Secondary | ICD-10-CM | POA: Diagnosis present

## 2024-08-22 DIAGNOSIS — W01198A Fall on same level from slipping, tripping and stumbling with subsequent striking against other object, initial encounter: Secondary | ICD-10-CM | POA: Diagnosis not present

## 2024-08-22 DIAGNOSIS — Z7901 Long term (current) use of anticoagulants: Secondary | ICD-10-CM | POA: Insufficient documentation

## 2024-08-22 DIAGNOSIS — Y9301 Activity, walking, marching and hiking: Secondary | ICD-10-CM | POA: Diagnosis not present

## 2024-08-22 HISTORY — DX: Disorder of kidney and ureter, unspecified: N28.9

## 2024-08-22 HISTORY — DX: Unspecified atrial fibrillation: I48.91

## 2024-08-22 HISTORY — DX: Hyperlipidemia, unspecified: E78.5

## 2024-08-22 MED ORDER — ACETAMINOPHEN 500 MG PO TABS
1000.0000 mg | ORAL_TABLET | Freq: Once | ORAL | Status: AC
  Administered 2024-08-22: 1000 mg via ORAL
  Filled 2024-08-22: qty 2

## 2024-08-22 MED ORDER — LIDOCAINE-EPINEPHRINE (PF) 2 %-1:200000 IJ SOLN
20.0000 mL | Freq: Once | INTRAMUSCULAR | Status: AC
  Administered 2024-08-22: 20 mL
  Filled 2024-08-22: qty 20

## 2024-08-22 NOTE — ED Notes (Signed)
 Wound care performed on L eyebrow, nose, upper lip, L hand. Wounds cleaned with wound cleanser, sterile gauze.  Bacitracin applied, dressed with non adherent dressing, secured with medipore tape.  Pt provided with wound care instructions, signs of infection to be aware of.

## 2024-08-22 NOTE — ED Triage Notes (Signed)
 Pt tripped over a root and fell. Pt has facial skin tears. Pt takes eliquis. Pt endorses headache. Unsure if LOC.

## 2024-08-22 NOTE — ED Notes (Signed)
 ED Provider at bedside for lac repair

## 2024-08-22 NOTE — Discharge Instructions (Addendum)
 Your head and neck CT was reassuring today.  No signs of skull fracture, brain bleed, or other injury.  You had 5 non-absorbable sutures placed in your left eyebrow today, 2 placed on your upper lip, and 5 placed in your left hand. You must get your facial sutures rechecked for possible removal in 5 days.  You must have your left hand sutures rechecked for possible removal in 10 to 14 days.  We recommend visiting your PCP for suture removal. However, you may also return back to the ER if you are unable to be seen by your PCP or at urgent care.   You may gently clean the area around your laceration as needed with water. Place antibiotic ointment such as bacitracin or neosporin over your laceration after cleaning the area.  Keep the laceration covered with sterile gauze as shown here if you are doing an activity in which it may get dirty. You may pick these supplies up at any drugstore.  Do not submerge your laceration in water (no baths, swimming) until it is fully healed. You may shower.   You may take up to 1000mg  of tylenol every 6 hours as needed for pain. Do not take more then 4g per day. You were given your first dose here today.   Return to the ER should you develop fever, chills, pus drainage from your wound, redness around your wound, severe headaches, any other new or concerning symptoms

## 2024-08-22 NOTE — ED Notes (Signed)
 D/c paperwork reviewed with pt, including follow up care.  All questions and/or concerns addressed at time of d/c.  No further needs expressed. . Pt verbalized understanding, Ambulatory with family to ED exit, NAD.

## 2024-08-22 NOTE — ED Provider Notes (Signed)
 [place] [facility] Provider Note   CSN: [id number] Arrival date & time: 08/22/24  1435     Patient presents with: [place]   Nicolas Adams is a 89 y.o. male with history of CHF, type 2 diabetes, hypertension, atrial fibrillation on Eliquis, presents with concern for mechanical fall that occurred just prior to arrival.  States he was walking outside when he tripped over a tree root and fell onto his face.  He denies any loss of consciousness.  Denies any chest pain, shortness of breath before or after the fall.  He denies any vision changes, nausea or vomiting.  Currently reports a frontal headache.  He denies pain elsewhere in his chest, arms, or legs.  He is on Eliquis.  Patient reports he is up-to-date on his tetanus.    Fall Associated symptoms include headaches.       Prior to Admission medications   Medication Sig Start Date End Date Taking? Authorizing Provider  acetaminophen (TYLENOL) 500 MG tablet Take 1,500 mg by mouth every 6 (six) hours as needed. pain     [provider]  benzonatate  (TESSALON ) 100 MG capsule Take 1 capsule (100 mg total) by mouth every 8 (eight) hours. 09/20/21   Claudene Lenis, NP  OVER THE COUNTER MEDICATION Take 1 tablet by mouth daily. Virgel     [provider]  PRESCRIPTION MEDICATION Take by mouth daily. Patient takes unknown blood pressure tab, diabetis medication     [provider]    Allergies: Aspirin and Corticosteroids    Review of Systems  Skin:  Positive for wound.  Neurological:  Positive for headaches.    Updated Vital Signs BP (!) 170/89   Pulse 74   Temp 98.1 F (36.7 C)   Resp 14   Ht 5' 11 (1.803 m)   Wt 86.2 kg   SpO2 96%   BMI 26.50 kg/m   Physical Exam Vitals and nursing note reviewed.  Constitutional:      General: He is not in acute distress.    Appearance: Normal appearance.  HENT:     Head: Normocephalic.      Comments: Patient with multiple  abrasions to the face including above the left eyebrow, to the tip of the nose, and to the chin.  Patient with 3 cm laceration over the left eyebrow.  Bleeding well-controlled upon arrival. Lots of dirt debris.  1 cm superficial laceration to the philtrum.  No involvement of the vermilion border.  Bleeding well-controlled upon arrival.  No obvious foreign debris.  Eyes:     Extraocular Movements: Extraocular movements intact.     Pupils: Pupils are equal, round, and reactive to light.  Neck:     Comments: No spinal tenderness to palpation Able to rotate neck left and right 45 degrees without difficulty Cardiovascular:     Rate and Rhythm: Normal rate and regular rhythm.     Comments: 2+ radial pulse bilaterally Pulmonary:     Effort: Pulmonary effort is normal.     Breath sounds: Normal breath sounds.     Comments: Lung sounds present and clear to auscultation bilaterally Talks in full sentences without difficulty Abdominal:     Comments: Abdomen soft and non-tender.    Musculoskeletal:     Cervical back: Normal range of motion.     Comments: General No obvious deformity. No erythema, edema, contusions, open wounds   Palpation Non-tender to palpation of the left clavicle, humerus, radius and ulna, carpal  bones, 1st-5th metacarpals and phalanges  Non-tender to palpation of the right clavicle, humerus, radius and ulna, carpal bones, 1st-5th metacarpals and phalanges  Non tender over the pelvis.  Non-tender of the left femur, patella, tibia or fibula  Non-tender of the right femur, patella, tibia or fibula   Non-tender over the cervical, thoracic, or lumbar spinous processes. Non-tender to palpation of the paraspinal region of the back. No tenderness to palpation of chest wall diffusely  ROM Full flexion and extension at the right and left hand MCPs, PIPs, DIPs Full ROM of shoulders bilaterally Full elbow, wrist, knee flexion and extension bilaterally Intact plantarflexion and  dorsiflexion, hip flexion bilaterally Ambulating without difficulty and without assistive device    Skin:    Comments: Dog ear shaped laceration to the palmar aspect of patient's right hand, approximately 5cm in size.  Bleeding well-controlled.  Mild amount of dirt debris in the wound.  Unable to visualize bone or tendons  Neurological:     General: No focal deficit present.     Mental Status: He is alert.     Comments: Sensation: Sensation intact throughout the bilateral upper and lower extremities  Strength: 5/5 strength with resisted elbow and wrist flexion and extension bilaterally 5/5 strength with resisted knee flexion and extension and ankle plantarflexion and dorsiflexion bilaterally           (all labs ordered are listed, but only abnormal results are displayed) Labs Reviewed - No data to display  EKG: EKG Interpretation Date/Time:  Saturday August 22 2024 14:59:39 EST Ventricular Rate:  70 PR Interval:    QRS Duration:  211 QT Interval:  530 QTC Calculation: 572 R Axis:   261  Text Interpretation: VENTRICULAR PACED RHYTHM Nonspecific IVCD with LAD Abnormal T, consider ischemia, lateral leads No previous ECGs available Confirmed by Ellouise Fine ([id number]) on 08/22/2024 3:08:49 PM  Radiology: CT Head Wo Contrast Result Date: 08/22/2024 CLINICAL DATA:  Tripped and fell, head trauma EXAM: CT HEAD WITHOUT CONTRAST TECHNIQUE: Contiguous axial images were obtained from the base of the skull through the vertex without intravenous contrast. RADIATION DOSE REDUCTION: This exam was performed according to the departmental dose-optimization program which includes automated exposure control, adjustment of the mA and/or kV according to patient size and/or use of iterative reconstruction technique. COMPARISON:  06/18/2022 FINDINGS: Brain: No acute infarct or hemorrhage. Lateral ventricles and midline structures are unremarkable. No acute extra-axial fluid collections. No mass  effect. Vascular: Diffuse atherosclerosis.  No hyperdense vessel. Skull: Normal. Negative for fracture or focal lesion. Sinuses/Orbits: No acute finding. Other: None. IMPRESSION: 1. No acute intracranial process. Electronically Signed   By: Ozell Daring M.D.   On: 08/22/2024 16:03   CT Maxillofacial Wo Contrast Result Date: 08/22/2024 CLINICAL DATA:  Blunt facial trauma, tripped and fell EXAM: CT MAXILLOFACIAL WITHOUT CONTRAST TECHNIQUE: Multidetector CT imaging of the maxillofacial structures was performed. Multiplanar CT image reconstructions were also generated. RADIATION DOSE REDUCTION: This exam was performed according to the departmental dose-optimization program which includes automated exposure control, adjustment of the mA and/or kV according to patient size and/or use of iterative reconstruction technique. COMPARISON:  None Available. FINDINGS: Osseous: No fracture or mandibular dislocation. No destructive process. Orbits: Negative. No traumatic or inflammatory finding. Sinuses: Chronic left maxillary sinus disease with mild mucoperiosteal thickening. No gas fluid levels. Soft tissues: Mild left supraorbital soft tissue swelling. Remaining soft tissues are unremarkable. Limited intracranial: No significant or unexpected finding. IMPRESSION: 1. No acute facial bone fracture. Electronically Signed  By: Ozell Daring M.D.   On: 08/22/2024 16:02   CT Cervical Spine Wo Contrast Result Date: 08/22/2024 CLINICAL DATA:  Neck trauma (Age >= 65y) Patient tripped and fell.  Headache.  On Eliquis. EXAM: CT CERVICAL SPINE WITHOUT CONTRAST TECHNIQUE: Multidetector CT imaging of the cervical spine was performed without intravenous contrast. Multiplanar CT image reconstructions were also generated. RADIATION DOSE REDUCTION: This exam was performed according to the departmental dose-optimization program which includes automated exposure control, adjustment of the mA and/or kV according to patient size and/or  use of iterative reconstruction technique. COMPARISON:  CT cervical spine 05/13/2023. FINDINGS: Alignment: The alignment is stable with straightening and trace anterolisthesis at C3-4, C4-5, T1-2 and T2-3. Skull base and vertebrae: No evidence of acute fracture or traumatic subluxation. Interbody ankylosis at C5-6. Chronic ossification around the odontoid process. Soft tissues and spinal canal: No prevertebral fluid or swelling. No visible canal hematoma. Disc levels: Multilevel spondylosis with disc bulging, uncinate spurring and facet hypertrophy. No large disc herniation or high-grade foraminal narrowing demonstrated. There is up to moderate foraminal narrowing, greatest on the right at C3-4 and bilaterally at C4-5 and C6-7. Upper chest: Clear lung apices. Left-sided pacemaker leads noted. There is aortic and great vessel atherosclerosis. Other: On the axial and coronal images, there is irregularity of the medial border of the right scapula, incompletely visualized. This was not imaged on the previous cervical spine CT. Appearance is suspicious for a fracture. IMPRESSION: 1. No evidence of acute cervical spine fracture, traumatic subluxation or static signs of instability. 2. Multilevel cervical spondylosis as described. 3. Possible fracture of the medial border of the right scapula, incompletely visualized. Recommend correlation with physical examination and consideration of chest CT for further evaluation. This area is difficult to evaluate by plain radiography. 4.  Aortic Atherosclerosis (ICD10-I70.0). Electronically Signed   By: Elsie Perone M.D.   On: 08/22/2024 16:01     .Laceration Repair  Date/Time: 08/22/2024 5:23 PM  Performed by: Veta Palma, PA-C Authorized by: Veta Palma, PA-C   Consent:    Consent obtained:  Verbal   Consent given by:  Patient   Risks, benefits, and alternatives were discussed: yes     Risks discussed:  Infection, pain, poor cosmetic result, retained  foreign body and poor wound healing   Alternatives discussed:  No treatment Universal protocol:    Imaging studies available: yes     Patient identity confirmed:  Verbally with patient Anesthesia:    Anesthesia method:  Local infiltration   Local anesthetic:  Lidocaine 1% WITH epi Laceration details:    Location:  Face   Face location:  L eyebrow   Length (cm):  3   Depth (mm):  4 Pre-procedure details:    Preparation:  Imaging obtained to evaluate for foreign bodies and patient was prepped and draped in usual sterile fashion Exploration:    Imaging obtained comment:  CT head   Imaging outcome: foreign body not noted     Wound exploration: wound explored through full range of motion and entire depth of wound visualized     Wound extent: foreign bodies/material     Wound extent: no underlying fracture     Foreign bodies/material:  Dirt Treatment:    Area cleansed with:  Chlorhexidine and soap and water   Amount of cleaning:  Extensive   Irrigation solution:  Sterile saline   Irrigation volume:  1L   Visualized foreign bodies/material removed: yes     Debridement:  Moderate  Undermining:  None   Scar revision: no   Skin repair:    Repair method:  Sutures   Suture size:  5-0   Suture material:  Prolene   Suture technique:  Simple interrupted   Number of sutures:  5 Repair type:    Repair type:  Intermediate Post-procedure details:    Dressing:  Antibiotic ointment and non-adherent dressing   Procedure completion:  Tolerated well, no immediate complications .Laceration Repair  Date/Time: 08/22/2024 6:41 PM  Performed by: Veta Palma, PA-C Authorized by: Veta Palma, PA-C   Consent:    Consent obtained:  Verbal   Consent given by:  Patient   Risks, benefits, and alternatives were discussed: yes     Risks discussed:  Infection, pain, poor cosmetic result and retained foreign body   Alternatives discussed:  No treatment Universal protocol:    Procedure  explained and questions answered to patient or proxy's satisfaction: yes     Imaging studies available: yes     Patient identity confirmed:  Verbally with patient Anesthesia:    Anesthesia method:  Local infiltration   Local anesthetic:  Lidocaine 1% w/o epi (1ml) Laceration details:    Location:  Face   Facial location: Philtrum.   Length (cm):  1 Pre-procedure details:    Preparation:  Patient was prepped and draped in usual sterile fashion and imaging obtained to evaluate for foreign bodies Exploration:    Imaging obtained comment:  CT head   Imaging outcome: foreign body not noted     Wound extent: no foreign body, no underlying fracture and no vascular damage   Treatment:    Area cleansed with:  Chlorhexidine   Amount of cleaning:  Standard   Debridement:  None Skin repair:    Repair method:  Sutures   Suture size:  5-0   Suture material:  Prolene   Suture technique:  Simple interrupted   Number of sutures:  2 Repair type:    Repair type:  Simple Post-procedure details:    Dressing:  Antibiotic ointment   Procedure completion:  Tolerated well, no immediate complications .Laceration Repair  Date/Time: 08/22/2024 6:43 PM  Performed by: Veta Palma, PA-C Authorized by: Veta Palma, PA-C   Consent:    Consent obtained:  Verbal   Consent given by:  Patient   Risks, benefits, and alternatives were discussed: yes     Risks discussed:  Infection, pain, poor cosmetic result, poor wound healing, retained foreign body and vascular damage   Alternatives discussed:  No treatment Universal protocol:    Imaging studies available: yes     Patient identity confirmed:  Verbally with patient Anesthesia:    Anesthesia method:  Local infiltration   Local anesthetic:  Lidocaine 1% w/o epi (4ml) Laceration details:    Location:  Hand   Hand location:  L palm   Length (cm):  5   Depth (mm):  3 Pre-procedure details:    Preparation:  Patient was prepped and draped in  usual sterile fashion Exploration:    Wound exploration: wound explored through full range of motion and entire depth of wound visualized     Wound extent: no foreign body, no nerve damage and no tendon damage   Treatment:    Area cleansed with:  Chlorhexidine   Amount of cleaning:  Standard   Irrigation solution:  Sterile saline   Irrigation volume:  1L   Visualized foreign bodies/material removed: yes     Debridement:  Minimal   Undermining:  None Skin repair:    Repair method:  Sutures   Suture size:  4-0   Suture material:  Prolene   Suture technique:  Simple interrupted   Number of sutures:  5 Repair type:    Repair type:  Simple Post-procedure details:    Dressing:  Antibiotic ointment and non-adherent dressing   Procedure completion:  Tolerated well, no immediate complications    Medications Ordered in the ED  lidocaine-EPINEPHrine (XYLOCAINE W/EPI) 2 %-1:200000 (PF) injection 20 mL (20 mLs Infiltration Given by Other 08/22/24 1515)  acetaminophen (TYLENOL) tablet 1,000 mg (1,000 mg Oral Given 08/22/24 1815)                                    Medical Decision Making Amount and/or Complexity of Data Reviewed Radiology: ordered.  Risk OTC drugs. Prescription drug management.    Differential diagnosis includes but is not limited to laceration, wound infection, tendon injury, nerve injury, vascular injury, retained foreign body, fracture, dislocation  ED Course:  Upon initial evaluation, patient is well-appearing, no acute distress.  Normal vital signs.  He has good story for mechanical fall, stating he tripped over a tree root.  He did not feel dizzy or short of breath before this fall.  He did not feel that he was going to pass out.  Low concern for cardiac etiology such as ACS/arrhythmia or other etiology to his fall. Do not feel patient needs any lab work at this time to check troponin or electrolytes.  I observed patient walking into the ER without difficulty  upon arrival.  Patient did sustain a laceration to the palmar aspect of the left hand.  He has full range of motion of the left 1st through 5th digits, brisk cap refill in the left hand, and intact sensation to all digits of the left hand.  No concern for vascular, tendon, or nerve injury at this time.  There is mild bleeding from all laceration sites currently, but no pulsatile or uncontrolled bleeding.   Imaging Studies ordered: I ordered imaging studies including CT head, CT maxillofacial, CT cervical spine I independently visualized the imaging with scope of interpretation limited to determining acute life threatening conditions related to emergency care. Imaging showed  CT head without evidence of intracranial hemorrhage CT maxillofacial without evidence of skull fracture  CT cervical spine: IMPRESSION:  1. No evidence of acute cervical spine fracture, traumatic  subluxation or static signs of instability.  2. Multilevel cervical spondylosis as described.  3. Possible fracture of the medial border of the right scapula,  incompletely visualized. Recommend correlation with physical  examination and consideration of chest CT for further evaluation.  This area is difficult to evaluate by plain radiography.  4.  Aortic Atherosclerosis (ICD10-I70.0).    I agree with the radiologist interpretation  EKG: EKG ordered and personally reviewed by myself which shows a paced rhythm   Medications Given: Tylenol Lidocaine with epinephrine  CT head, CT maxillofacial, and CT cervical spine imaging was reviewed which revealed no retained foreign body, no signs of skull fracture or intracranial hemorrhage.  A possible fracture of the right scapula was noted on cervical CT.  However, patient does not have any tenderness of the right scapula, has full range of motion of the right shoulder, do not feel he needs any additional imaging of this area as clinical concern for acute fracture is low.    Patient's wounds  were irrigated well with sterile saline and cleaned with chlorhexidine swabs. Anesthesia achieved with lidocaine with epinephrine. Patient's laceration above the left eyebrow was repaired with 5 simple interrupted sutures which stopped the bleeding patient was having.  Patient's laceration on the philtrum of the lip was repaired with 2 simple interrupted sutures.  Patient's laceration to the left hand was repaired with 5 simple interrupted sutures and bleeding well-controlled.  The abrasions to patient's face for also cleaned by nursing and dressed with bacitracin ointment.  Patient tolerated this well without any immediate complications. Suture repair occurred less than 24 hours after initial injury. Patient remains neurovascularly intact after suture placement. Patient's Tdap was last updated in 2020, no indication for a tetanus shot today as this was within the past 10 years.  Patient stable and appropriate for discharge home.     Impression: Left hand laceration Left eyebrow laceration Laceration to philtrum Mechanical fall  Disposition:  Patient discharged home with instructions to keep laceration site clean with gentle soap and water. Apply neosporin or bacitracin over the area as shown here and keep covered with sterile dressing. Follow up with PCP or return to ER in 5 days for suture removal for the facial sutures, 10 to 14 days for removal of the sutures in his left hand.  Tylenol as needed for pain.  Return precautions given and patient verbalized understanding.    This chart was dictated using voice recognition software, Dragon. Despite the best efforts of this provider to proofread and correct errors, errors may still occur which can change documentation meaning.       Final diagnoses:  Facial laceration, initial encounter  Laceration of superficial palmar arch of hand, left, initial encounter    ED Discharge Orders     None          Veta Palma,  PA-C 08/22/24 1850    Ellouise Richerd POUR, DO 08/22/24 2006
